# Patient Record
Sex: Female | Born: 1935 | Race: White | Hispanic: No | Smoking: Never smoker
Health system: Southern US, Community
[De-identification: ages and names within clinical notes are randomized; demographics above are authoritative.]

## PROBLEM LIST (undated history)

## (undated) DIAGNOSIS — I10 Essential (primary) hypertension: Secondary | ICD-10-CM

## (undated) DIAGNOSIS — F0390 Unspecified dementia without behavioral disturbance: Secondary | ICD-10-CM

## (undated) DIAGNOSIS — I509 Heart failure, unspecified: Secondary | ICD-10-CM

## (undated) HISTORY — PX: EYE SURGERY: SHX253

---

## 2018-10-31 ENCOUNTER — Ambulatory Visit: Payer: Medicare Other | Admitting: Physical Therapy

## 2018-11-02 ENCOUNTER — Ambulatory Visit: Payer: Self-pay | Admitting: Physical Therapy

## 2018-11-07 ENCOUNTER — Ambulatory Visit: Payer: Self-pay | Admitting: Physical Therapy

## 2018-11-14 ENCOUNTER — Ambulatory Visit: Payer: Medicare Other | Admitting: Physical Therapy

## 2018-11-16 ENCOUNTER — Ambulatory Visit: Payer: Medicare Other

## 2018-11-21 ENCOUNTER — Ambulatory Visit: Payer: Medicare Other | Admitting: Physical Therapy

## 2018-11-23 ENCOUNTER — Ambulatory Visit: Payer: Medicare Other | Admitting: Physical Therapy

## 2018-11-30 ENCOUNTER — Ambulatory Visit: Payer: Medicare Other | Admitting: Physical Therapy

## 2018-12-05 ENCOUNTER — Ambulatory Visit: Payer: Medicare Other | Admitting: Physical Therapy

## 2018-12-07 ENCOUNTER — Ambulatory Visit: Payer: Medicare Other | Admitting: Physical Therapy

## 2018-12-12 ENCOUNTER — Ambulatory Visit: Payer: Medicare Other | Admitting: Physical Therapy

## 2018-12-14 ENCOUNTER — Ambulatory Visit: Payer: Medicare Other | Admitting: Physical Therapy

## 2018-12-18 ENCOUNTER — Ambulatory Visit: Payer: Medicare Other | Admitting: Physical Therapy

## 2018-12-19 ENCOUNTER — Ambulatory Visit: Payer: Medicare Other | Admitting: Physical Therapy

## 2018-12-21 ENCOUNTER — Ambulatory Visit: Payer: Medicare Other | Admitting: Physical Therapy

## 2018-12-26 ENCOUNTER — Ambulatory Visit: Payer: Medicare Other | Admitting: Physical Therapy

## 2018-12-28 ENCOUNTER — Ambulatory Visit: Payer: Medicare Other | Admitting: Physical Therapy

## 2019-04-08 ENCOUNTER — Emergency Department: Payer: Medicare Other

## 2019-04-08 ENCOUNTER — Other Ambulatory Visit: Payer: Self-pay

## 2019-04-08 ENCOUNTER — Emergency Department
Admission: EM | Admit: 2019-04-08 | Discharge: 2019-04-08 | Disposition: A | Discharge status: Home / Self Care | Payer: Medicare Other | Attending: Student in an Organized Health Care Education/Training Program | Admitting: Student in an Organized Health Care Education/Training Program

## 2019-04-08 DIAGNOSIS — Z8673 Personal history of transient ischemic attack (TIA), and cerebral infarction without residual deficits: Secondary | ICD-10-CM | POA: Insufficient documentation

## 2019-04-08 DIAGNOSIS — R1011 Right upper quadrant pain: Secondary | ICD-10-CM | POA: Insufficient documentation

## 2019-04-08 DIAGNOSIS — R1900 Intra-abdominal and pelvic swelling, mass and lump, unspecified site: Secondary | ICD-10-CM

## 2019-04-08 DIAGNOSIS — R531 Weakness: Secondary | ICD-10-CM | POA: Diagnosis not present

## 2019-04-08 DIAGNOSIS — I110 Hypertensive heart disease with heart failure: Secondary | ICD-10-CM | POA: Insufficient documentation

## 2019-04-08 DIAGNOSIS — R1909 Other intra-abdominal and pelvic swelling, mass and lump: Secondary | ICD-10-CM | POA: Diagnosis not present

## 2019-04-08 DIAGNOSIS — M25561 Pain in right knee: Secondary | ICD-10-CM | POA: Insufficient documentation

## 2019-04-08 DIAGNOSIS — Z7902 Long term (current) use of antithrombotics/antiplatelets: Secondary | ICD-10-CM | POA: Diagnosis not present

## 2019-04-08 DIAGNOSIS — I509 Heart failure, unspecified: Secondary | ICD-10-CM | POA: Diagnosis not present

## 2019-04-08 HISTORY — DX: Essential (primary) hypertension: I10

## 2019-04-08 HISTORY — DX: Heart failure, unspecified: I50.9

## 2019-04-08 LAB — CBC WITH DIFFERENTIAL/PLATELET
Abs Immature Granulocytes: 0.01 10*3/uL (ref 0.00–0.07)
Basophils Absolute: 0 10*3/uL (ref 0.0–0.1)
Basophils Relative: 0 %
Eosinophils Absolute: 0.1 10*3/uL (ref 0.0–0.5)
Eosinophils Relative: 2 %
HCT: 42.1 % (ref 36.0–46.0)
Hemoglobin: 14.2 g/dL (ref 12.0–15.0)
Immature Granulocytes: 0 %
Lymphocytes Relative: 31 %
Lymphs Abs: 1.9 10*3/uL (ref 0.7–4.0)
MCH: 31.8 pg (ref 26.0–34.0)
MCHC: 33.7 g/dL (ref 30.0–36.0)
MCV: 94.4 fL (ref 80.0–100.0)
Monocytes Absolute: 0.7 10*3/uL (ref 0.1–1.0)
Monocytes Relative: 11 %
Neutro Abs: 3.3 10*3/uL (ref 1.7–7.7)
Neutrophils Relative %: 56 %
Platelets: 249 10*3/uL (ref 150–400)
RBC: 4.46 MIL/uL (ref 3.87–5.11)
RDW: 14.6 % (ref 11.5–15.5)
WBC: 5.9 10*3/uL (ref 4.0–10.5)
nRBC: 0 % (ref 0.0–0.2)

## 2019-04-08 LAB — URINALYSIS, COMPLETE (UACMP) WITH MICROSCOPIC
Bilirubin Urine: NEGATIVE
Glucose, UA: NEGATIVE mg/dL
Hgb urine dipstick: NEGATIVE
Ketones, ur: NEGATIVE mg/dL
Leukocytes,Ua: NEGATIVE
Nitrite: NEGATIVE
Protein, ur: NEGATIVE mg/dL
Specific Gravity, Urine: 1.006 (ref 1.005–1.030)
WBC, UA: NONE SEEN WBC/hpf (ref 0–5)
pH: 8 (ref 5.0–8.0)

## 2019-04-08 LAB — COMPREHENSIVE METABOLIC PANEL
ALT: 21 U/L (ref 0–44)
AST: 26 U/L (ref 15–41)
Albumin: 3.6 g/dL (ref 3.5–5.0)
Alkaline Phosphatase: 97 U/L (ref 38–126)
Anion gap: 9 (ref 5–15)
BUN: 21 mg/dL (ref 8–23)
CO2: 29 mmol/L (ref 22–32)
Calcium: 9.6 mg/dL (ref 8.9–10.3)
Chloride: 100 mmol/L (ref 98–111)
Creatinine, Ser: 1.05 mg/dL — ABNORMAL HIGH (ref 0.44–1.00)
GFR calc Af Amer: 54 mL/min — ABNORMAL LOW (ref >60)
GFR calc non Af Amer: 46 mL/min — ABNORMAL LOW (ref >60)
Glucose, Bld: 114 mg/dL — ABNORMAL HIGH (ref 70–99)
Potassium: 4.7 mmol/L (ref 3.5–5.1)
Sodium: 138 mmol/L (ref 135–145)
Total Bilirubin: 1.1 mg/dL (ref 0.3–1.2)
Total Protein: 7.2 g/dL (ref 6.5–8.1)

## 2019-04-08 MED ORDER — IOHEXOL 300 MG/ML  SOLN
100.0000 mL | Freq: Once | INTRAMUSCULAR | Status: AC | PRN
  Administered 2019-04-08: 14:00:00 100 mL via INTRAVENOUS

## 2019-04-08 NOTE — ED Provider Notes (Signed)
[facility] Emergency Department Provider Note    First MD Initiated Contact with Patient 04/08/19 1241     (approximate)  I have reviewed the triage vital signs and the nursing notes.   HISTORY  Chief Complaint Bone Pain    HPI Emily Martin is a 85 y.o. female of heart failure as well as hypertension and TIA on Plavix presents the ER with generalized weakness and confusion starting this morning.  No report of any falls.  Patient was having some chills and complaining of abdominal pain.  She is currently having a little bit of right upper quadrant abdominal pain but states it is otherwise improved.  Did not have any measured fevers at home but family states that she felt warm and they were concerned for fever.  Not had any cough or congestion.  No recent antibiotics. Have pt and HH at home.    Past Medical History:  Diagnosis Date  . CHF (congestive heart failure) ([place])   . Hypertension    No family history on file. Past Surgical History:  Procedure Laterality Date  . EYE SURGERY     There are no active problems to display for this patient.     Prior to Admission medications   Not on File    Allergies Lisinopril    Social History Social History   Tobacco Use  . Smoking status: Never Smoker  . Smokeless tobacco: Never Used  Substance Use Topics  . Alcohol use: Not Currently  . Drug use: Never    Review of Systems Patient denies headaches, rhinorrhea, blurry vision, numbness, shortness of breath, chest pain, edema, cough, abdominal pain, nausea, vomiting, diarrhea, dysuria, fevers, rashes or hallucinations unless otherwise stated above in HPI. ____________________________________________   PHYSICAL EXAM:  VITAL SIGNS: Vitals:   04/08/19 1300 04/08/19 1536  BP: (!) 172/123 (!) 181/93  Pulse: 74   Resp: 15 20  Temp:    SpO2: 96% 100%    Constitutional: Alert and oriented. pleasant Eyes: Conjunctivae are normal.  Head:  Atraumatic. Nose: No congestion/rhinnorhea. Mouth/Throat: Mucous membranes are moist.   Neck: No stridor. Painless ROM.  Cardiovascular: Normal rate, regular rhythm. Grossly normal heart sounds.  Good peripheral circulation. Respiratory: Normal respiratory effort.  No retractions. Lungs CTAB. Gastrointestinal: Soft with mild ruq ttp. No distention. No abdominal bruits. No CVA tenderness. Genitourinary:  Musculoskeletal: No lower extremity tenderness nor edema.  ttp of right knee without overlying warmth or erythema. No joint effusions. Neurologic:  Normal speech and language. No gross focal neurologic deficits are appreciated. No facial droop  MAE. Able to ambulate.  No drift Skin:  Skin is warm, dry and intact. No rash noted. Psychiatric: Mood and affect are normal. Speech and behavior are normal.  ____________________________________________   LABS (all labs ordered are listed, but only abnormal results are displayed)  Results for orders placed or performed during the hospital encounter of 04/08/19 (from the past 24 hour(s))  CBC with Differential/Platelet     Status: None   Collection Time: 04/08/19 12:59 PM  Result Value Ref Range   WBC 5.9 4.0 - 10.5 K/uL   RBC 4.46 3.87 - 5.11 MIL/uL   Hemoglobin 14.2 12.0 - 15.0 g/dL   HCT 42.1 36.0 - 46.0 %   MCV 94.4 80.0 - 100.0 fL   MCH 31.8 26.0 - 34.0 pg   MCHC 33.7 30.0 - 36.0 g/dL   RDW 14.6 11.5 - 15.5 %   Platelets 249 150 - 400 K/uL  nRBC 0.0 0.0 - 0.2 %   Neutrophils Relative % 56 %   Neutro Abs 3.3 1.7 - 7.7 K/uL   Lymphocytes Relative 31 %   Lymphs Abs 1.9 0.7 - 4.0 K/uL   Monocytes Relative 11 %   Monocytes Absolute 0.7 0.1 - 1.0 K/uL   Eosinophils Relative 2 %   Eosinophils Absolute 0.1 0.0 - 0.5 K/uL   Basophils Relative 0 %   Basophils Absolute 0.0 0.0 - 0.1 K/uL   Immature Granulocytes 0 %   Abs Immature Granulocytes 0.01 0.00 - 0.07 K/uL  Comprehensive metabolic panel     Status: Abnormal   Collection Time:  04/08/19 12:59 PM  Result Value Ref Range   Sodium 138 135 - 145 mmol/L   Potassium 4.7 3.5 - 5.1 mmol/L   Chloride 100 98 - 111 mmol/L   CO2 29 22 - 32 mmol/L   Glucose, Bld 114 (H) 70 - 99 mg/dL   BUN 21 8 - 23 mg/dL   Creatinine, Ser 1.05 (H) 0.44 - 1.00 mg/dL   Calcium 9.6 8.9 - 10.3 mg/dL   Total Protein 7.2 6.5 - 8.1 g/dL   Albumin 3.6 3.5 - 5.0 g/dL   AST 26 15 - 41 U/L   ALT 21 0 - 44 U/L   Alkaline Phosphatase 97 38 - 126 U/L   Total Bilirubin 1.1 0.3 - 1.2 mg/dL   GFR calc non Af Amer 46 (L) >60 mL/min   GFR calc Af Amer 54 (L) >60 mL/min   Anion gap 9 5 - 15  Urinalysis, Complete w Microscopic     Status: Abnormal   Collection Time: 04/08/19  1:14 PM  Result Value Ref Range   Color, Urine STRAW (A) YELLOW   APPearance CLEAR (A) CLEAR   Specific Gravity, Urine 1.006 1.005 - 1.030   pH 8.0 5.0 - 8.0   Glucose, UA NEGATIVE NEGATIVE mg/dL   Hgb urine dipstick NEGATIVE NEGATIVE   Bilirubin Urine NEGATIVE NEGATIVE   Ketones, ur NEGATIVE NEGATIVE mg/dL   Protein, ur NEGATIVE NEGATIVE mg/dL   Nitrite NEGATIVE NEGATIVE   Leukocytes,Ua NEGATIVE NEGATIVE   RBC / HPF 0-5 0 - 5 RBC/hpf   WBC, UA NONE SEEN 0 - 5 WBC/hpf   Bacteria, UA RARE (A) NONE SEEN   Squamous Epithelial / LPF 0-5 0 - 5   ____________________________________________  EKG My review and personal interpretation at Time: 12:33   Indication: weakness  Rate: 80  Rhythm: sinus Axis: normal Other: normal intervals, no stemi ____________________________________________  RADIOLOGY  I personally reviewed all radiographic images ordered to evaluate for the above acute complaints and reviewed radiology reports and findings.  These findings were personally discussed with the patient.  Please see medical record for radiology report.  ____________________________________________   PROCEDURES  Procedure(s) performed:  Procedures    Critical Care performed: no ____________________________________________    INITIAL IMPRESSION / ASSESSMENT AND PLAN / ED COURSE  Pertinent labs & imaging results that were available during my care of the patient were reviewed by me and considered in my medical decision making (see chart for details).   DDX: Dehydration, sepsis, pna, uti, hypoglycemia, cva, drug effect, withdrawal, encephalitis   Kenisha Jeffery is a 85 y.o. who presents to the ED with sxs as described above.  Patient is AFVSS in ED. Exam as above. Given current presentation have considered the above differential.  Will order blood work, ct and radiographs for the above differential.  The patient will be  placed on continuous pulse oximetry and telemetry for monitoring.  Laboratory evaluation will be sent to evaluate for the above complaints.      Clinical Course as of Apr 08 1599  Sun Apr 08, 2019  1516 Discussed CT imaging with patient and family member at bedside.  These masses are reportedly known and they had elected to not pursue any diagnostic testing previously but would like referral to OB/GYN.  She does not have any acute pain at this time.  Patient feels like she is back at her baseline.     [PR]  1551 Patient able to ambulate with use of assistive device.  Discussed possibility of TIA with patient and family.  She is requesting discharge home to follow-up with outpatient follow-up.  Do not identify any objective finding suggest CVA.  Has had multiple TIAs in the past.  She is established with PCP.  She adamantly does not want to be in the hospital which I think is reasonable given her age.  Family member at bedside agrees with plan.  Discussed signs and symptoms for which she should return to the ER.   [PR]    Clinical Course User Index [PR] Merlyn Lot, MD    The patient was evaluated in Emergency Department today for the symptoms described in the history of present illness. He/she was evaluated in the context of the global COVID-19 pandemic, which necessitated consideration that the  patient might be at risk for infection with the SARS-CoV-2 virus that causes COVID-19. Institutional protocols and algorithms that pertain to the evaluation of patients at risk for COVID-19 are in a state of rapid change based on information released by regulatory bodies including the CDC and federal and state organizations. These policies and algorithms were followed during the patient's care in the ED.  As part of my medical decision making, I reviewed the following data within the [facility] notes reviewed and incorporated, Labs reviewed, notes from prior ED visits and [place] Controlled Substance Database   ____________________________________________   FINAL CLINICAL IMPRESSION(S) / ED DIAGNOSES  Final diagnoses:  Weakness  Acute pain of right knee  Pelvic mass in female      NEW MEDICATIONS STARTED DURING THIS VISIT:  New Prescriptions   No medications on file     Note:  This document was prepared using Dragon voice recognition software and may include unintentional dictation errors.    Merlyn Lot, MD 04/08/19 1600

## 2019-04-08 NOTE — ED Notes (Signed)
Pt ambulatory in room from bed to toilet and back to bed via walker. Per granddaughter pt is walking at her baseline. Pt denies any dizziness or problems with walking. Dr. Quentin Cornwall notified.

## 2019-04-08 NOTE — ED Triage Notes (Signed)
Pt arrived via [facility] from home, pt complaining of bone pain/ joint pain.  Zero pain at this time.

## 2019-09-07 ENCOUNTER — Emergency Department: Payer: Medicare Other

## 2019-09-07 ENCOUNTER — Emergency Department
Admission: EM | Admit: 2019-09-07 | Discharge: 2019-09-07 | Disposition: A | Discharge status: Home / Self Care | Payer: Medicare Other | Attending: Emergency Medicine | Admitting: Emergency Medicine

## 2019-09-07 ENCOUNTER — Other Ambulatory Visit: Payer: Self-pay

## 2019-09-07 ENCOUNTER — Encounter: Payer: Self-pay | Admitting: Emergency Medicine

## 2019-09-07 DIAGNOSIS — I509 Heart failure, unspecified: Secondary | ICD-10-CM | POA: Insufficient documentation

## 2019-09-07 DIAGNOSIS — Y929 Unspecified place or not applicable: Secondary | ICD-10-CM | POA: Diagnosis not present

## 2019-09-07 DIAGNOSIS — I110 Hypertensive heart disease with heart failure: Secondary | ICD-10-CM | POA: Insufficient documentation

## 2019-09-07 DIAGNOSIS — Y9389 Activity, other specified: Secondary | ICD-10-CM | POA: Diagnosis not present

## 2019-09-07 DIAGNOSIS — Y998 Other external cause status: Secondary | ICD-10-CM | POA: Diagnosis not present

## 2019-09-07 DIAGNOSIS — S300XXA Contusion of lower back and pelvis, initial encounter: Secondary | ICD-10-CM | POA: Diagnosis not present

## 2019-09-07 DIAGNOSIS — Z20822 Contact with and (suspected) exposure to covid-19: Secondary | ICD-10-CM | POA: Insufficient documentation

## 2019-09-07 DIAGNOSIS — W010XXA Fall on same level from slipping, tripping and stumbling without subsequent striking against object, initial encounter: Secondary | ICD-10-CM | POA: Insufficient documentation

## 2019-09-07 DIAGNOSIS — F0390 Unspecified dementia without behavioral disturbance: Secondary | ICD-10-CM | POA: Diagnosis not present

## 2019-09-07 DIAGNOSIS — W19XXXA Unspecified fall, initial encounter: Secondary | ICD-10-CM

## 2019-09-07 DIAGNOSIS — S3992XA Unspecified injury of lower back, initial encounter: Secondary | ICD-10-CM | POA: Diagnosis present

## 2019-09-07 HISTORY — DX: Unspecified dementia, unspecified severity, without behavioral disturbance, psychotic disturbance, mood disturbance, and anxiety: F03.90

## 2019-09-07 LAB — COMPREHENSIVE METABOLIC PANEL
ALT: 34 U/L (ref 0–44)
AST: 29 U/L (ref 15–41)
Albumin: 3.4 g/dL — ABNORMAL LOW (ref 3.5–5.0)
Alkaline Phosphatase: 105 U/L (ref 38–126)
Anion gap: 11 (ref 5–15)
BUN: 20 mg/dL (ref 8–23)
CO2: 28 mmol/L (ref 22–32)
Calcium: 9.2 mg/dL (ref 8.9–10.3)
Chloride: 104 mmol/L (ref 98–111)
Creatinine, Ser: 0.61 mg/dL (ref 0.44–1.00)
GFR calc Af Amer: >60 mL/min (ref >60)
GFR calc non Af Amer: >60 mL/min (ref >60)
Glucose, Bld: 119 mg/dL — ABNORMAL HIGH (ref 70–99)
Potassium: 4.3 mmol/L (ref 3.5–5.1)
Sodium: 143 mmol/L (ref 135–145)
Total Bilirubin: 0.9 mg/dL (ref 0.3–1.2)
Total Protein: 6.7 g/dL (ref 6.5–8.1)

## 2019-09-07 LAB — CBC WITH DIFFERENTIAL/PLATELET
Abs Immature Granulocytes: 0.06 10*3/uL (ref 0.00–0.07)
Basophils Absolute: 0 10*3/uL (ref 0.0–0.1)
Basophils Relative: 0 %
Eosinophils Absolute: 0.1 10*3/uL (ref 0.0–0.5)
Eosinophils Relative: 1 %
HCT: 42.5 % (ref 36.0–46.0)
Hemoglobin: 13.8 g/dL (ref 12.0–15.0)
Immature Granulocytes: 1 %
Lymphocytes Relative: 16 %
Lymphs Abs: 1.5 10*3/uL (ref 0.7–4.0)
MCH: 31.6 pg (ref 26.0–34.0)
MCHC: 32.5 g/dL (ref 30.0–36.0)
MCV: 97.3 fL (ref 80.0–100.0)
Monocytes Absolute: 1.3 10*3/uL — ABNORMAL HIGH (ref 0.1–1.0)
Monocytes Relative: 14 %
Neutro Abs: 6.1 10*3/uL (ref 1.7–7.7)
Neutrophils Relative %: 68 %
Platelets: 204 10*3/uL (ref 150–400)
RBC: 4.37 MIL/uL (ref 3.87–5.11)
RDW: 15.1 % (ref 11.5–15.5)
WBC: 9.1 10*3/uL (ref 4.0–10.5)
nRBC: 0 % (ref 0.0–0.2)

## 2019-09-07 LAB — PROTIME-INR
INR: 1 (ref 0.8–1.2)
Prothrombin Time: 12.8 seconds (ref 11.4–15.2)

## 2019-09-07 LAB — TYPE AND SCREEN
ABO/RH(D): A POS
Antibody Screen: NEGATIVE

## 2019-09-07 NOTE — ED Notes (Signed)
Pt had a 20g in the RT FA that was removed prior to ED departure,. Site clean, warm and dry./ Catheter in tact.

## 2019-09-07 NOTE — ED Provider Notes (Signed)
[facility] Emergency Department Provider Note  ____________________________________________  Time seen: Approximately 6:49 PM  I have reviewed the triage vital signs and the nursing notes.   HISTORY  Chief Complaint Fall    HPI Emily Martin is a 86 y.o. female who presents the emergency department for evaluation after a fall today.  Patient's granddaughter was with the patient, states that she was using her walker, became out of balance, was leaning backwards and she could not catch her before she fell.  Patient fell in a seated position landing on her buttocks.  She did not hit her head or pass out.  This is patient's second fall, she did have a fall 2 days ago.   The fall 2 days ago patient did hit her head.  She is not on blood thinners.  She has had no change in mental status.  She does have dementia but there is no significant changes from her baseline.  Patient has had no vomiting, subsequent syncope.  Patient had not been complaining of headache or visual changes to her granddaughter.  Patient was brought to the emergency department for further evaluation.  History of CHF, dementia and hypertension.        Past Medical History:  Diagnosis Date  . CHF (congestive heart failure) ([place])   . Dementia ([place])   . Hypertension     There are no problems to display for this patient.   Past Surgical History:  Procedure Laterality Date  . EYE SURGERY      Prior to Admission medications   Not on File    Allergies Lisinopril  No family history on file.  Social History Social History   Tobacco Use  . Smoking status: Never Smoker  . Smokeless tobacco: Never Used  Substance Use Topics  . Alcohol use: Not Currently  . Drug use: Never     Review of Systems  Constitutional: No fever/chills Eyes: No visual changes. No discharge ENT: No upper respiratory complaints. Cardiovascular: no chest pain. Respiratory: no cough. No SOB. Gastrointestinal:  No abdominal pain.  No nausea, no vomiting.  No diarrhea.  No constipation. Musculoskeletal: Positive for tailbone pain after a fall Skin: Negative for rash, abrasions, lacerations, ecchymosis. Neurological: Negative for headaches, focal weakness or numbness. 10-point ROS otherwise negative.  ____________________________________________   PHYSICAL EXAM:  VITAL SIGNS: ED Triage Vitals [09/07/19 1712]  Enc Vitals Group     BP (!) 195/89     Pulse Rate 89     Resp 16     Temp 98.8 F (37.1 C)     Temp Source Oral     SpO2 95 %     Weight      Height      Head Circumference      Peak Flow      Pain Score      Pain Loc      Pain Edu?      Excl. in [place]?      Constitutional: Alert and oriented. Well appearing and in no acute distress. Eyes: Conjunctivae are normal. PERRL. EOMI. Head: Atraumatic. ENT:      Ears:       Nose: No congestion/rhinnorhea.      Mouth/Throat: Mucous membranes are moist.  Neck: No stridor.  No cervical spine tenderness to palpation.  Cardiovascular: Normal rate, regular rhythm. Normal S1 and S2.  Good peripheral circulation. Respiratory: Normal respiratory effort without tachypnea or retractions. Lungs CTAB. Good air entry to the bases  with no decreased or absent breath sounds. Gastrointestinal: Bowel sounds 4 quadrants. Soft and nontender to palpation. No guarding or rigidity. No palpable masses. No distention. No CVA tenderness. Musculoskeletal: Full range of motion to all extremities. No gross deformities appreciated.  Visualization of extremities reveals no shortening or rotation of the lower extremities, no obvious deformity to the upper extremities.  No laceration, abrasions, significant areas of ecchymosis.  Patient is tender to palpation in the lower lumbar region extending into the sacral spine.  Mild SI joint tenderness bilaterally.  No significant tenderness to palpation over bilateral iliac crest, bilateral hips.  Examination of the extremities  and is otherwise reassuring.   Neurologic:  Normal speech and language. No gross focal neurologic deficits are appreciated.  Skin:  Skin is warm, dry and intact. No rash noted. Psychiatric: Mood and affect are normal. Speech and behavior are normal. Patient exhibits appropriate but slightly limited insight given her dementia, however is at baseline according to the granddaughter.   ____________________________________________   LABS (all labs ordered are listed, but only abnormal results are displayed)  Labs Reviewed  CBC WITH DIFFERENTIAL/PLATELET - Abnormal; Notable for the following components:      Result Value   Monocytes Absolute 1.3 (*)    All other components within normal limits  COMPREHENSIVE METABOLIC PANEL - Abnormal; Notable for the following components:   Glucose, Bld 119 (*)    Albumin 3.4 (*)    All other components within normal limits  SARS CORONAVIRUS 2 (TAT 6-24 HRS)  PROTIME-INR  URINALYSIS, COMPLETE (UACMP) WITH MICROSCOPIC  TYPE AND SCREEN   ____________________________________________  EKG   ____________________________________________  RADIOLOGY I personally viewed and evaluated these images as part of my medical decision making, as well as reviewing the written report by the radiologist.  DG Lumbar Spine 2-3 Views  Result Date: 09/07/2019 CLINICAL DATA:  86 year old female with fall and back pain. EXAM: LUMBAR SPINE - 2-3 VIEW COMPARISON:  CT of the abdomen pelvis dated 04/08/2019. FINDINGS: There is no acute fracture or subluxation of the lumbar spine. Old T11 compression fracture similar to prior CT. There is grade 2 L5-S1 anterolisthesis. The bones are osteopenic. Lower lumbar facet arthropathy. The soft tissues are unremarkable. IMPRESSION: 1. No acute/traumatic lumbar spine pathology. 2. Osteopenia with multilevel degenerative changes and grade 2 L5-S1 anterolisthesis. 3. Old T11 compression fracture similar to prior CT. Electronically Signed   By:  Anner Crete M.D.   On: 09/07/2019 18:47   DG Sacrum/Coccyx  Result Date: 09/07/2019 CLINICAL DATA:  Unwitnessed fall, low back pain EXAM: SACRUM AND COCCYX - 2+ VIEW COMPARISON:  CT abdomen and pelvis 04/08/2019 FINDINGS: No visible sacral or coccygeal fracture or focal bone lesion. Stable 11 mm anterolisthesis of L4 on L5 with advanced degenerative disc and facet disease. IMPRESSION: No acute bony abnormality. Electronically Signed   By: Rolm Baptise M.D.   On: 09/07/2019 18:48   CT Head Wo Contrast  Result Date: 09/07/2019 CLINICAL DATA:  Fall EXAM: CT HEAD WITHOUT CONTRAST CT CERVICAL SPINE WITHOUT CONTRAST TECHNIQUE: Multidetector CT imaging of the head and cervical spine was performed following the standard protocol without intravenous contrast. Multiplanar CT image reconstructions of the cervical spine were also generated. COMPARISON:  CT brain 04/08/2019 FINDINGS: CT HEAD FINDINGS Brain: No acute territorial infarction, hemorrhage or intracranial mass. Atrophy. Extensive hypodensity within the white matter presumably due to chronic small vessel ischemic change. Chronic lacunar infarcts in the basal ganglia. Chronic small bilateral infarcts within the cerebellum. Encephalomalacia  within the right temporal lobe consistent with chronic infarct. Mildly enlarged ventricles without change Vascular: No hyperdense vessels. Carotid vascular and vertebral artery calcification Skull: Normal. Negative for fracture or focal lesion. Sinuses/Orbits: Probable old fracture deformity medial wall left orbit. Small fluid in the right sphenoid sinus. Mucosal thickening in the ethmoid sinuses Other: None CT CERVICAL SPINE FINDINGS Alignment: No subluxation.  Facet alignment within normal limits. Skull base and vertebrae: No acute fracture. No primary bone lesion or focal pathologic process. Soft tissues and spinal canal: No prevertebral fluid or swelling. No visible canal hematoma. Disc levels: Mild diffuse  degenerative changes of the cervical spine. Multiple level facet degenerative change. Upper chest: Lung apices are clear. 1 cm calcified nodule right lobe of thyroid. 1.3 cm hypodense nodule left lobe of thyroid. No further workup recommended based on size of lesion and age of patient. Other: None IMPRESSION: 1. No CT evidence for acute intracranial abnormality. Atrophy and extensive chronic small vessel ischemic change of the white matter. Chronic right temporal lobe infarct. 2. Degenerative changes of the cervical spine. No acute osseous abnormality. Electronically Signed   By: Donavan Foil M.D.   On: 09/07/2019 18:55   CT Cervical Spine Wo Contrast  Result Date: 09/07/2019 CLINICAL DATA:  Fall EXAM: CT HEAD WITHOUT CONTRAST CT CERVICAL SPINE WITHOUT CONTRAST TECHNIQUE: Multidetector CT imaging of the head and cervical spine was performed following the standard protocol without intravenous contrast. Multiplanar CT image reconstructions of the cervical spine were also generated. COMPARISON:  CT brain 04/08/2019 FINDINGS: CT HEAD FINDINGS Brain: No acute territorial infarction, hemorrhage or intracranial mass. Atrophy. Extensive hypodensity within the white matter presumably due to chronic small vessel ischemic change. Chronic lacunar infarcts in the basal ganglia. Chronic small bilateral infarcts within the cerebellum. Encephalomalacia within the right temporal lobe consistent with chronic infarct. Mildly enlarged ventricles without change Vascular: No hyperdense vessels. Carotid vascular and vertebral artery calcification Skull: Normal. Negative for fracture or focal lesion. Sinuses/Orbits: Probable old fracture deformity medial wall left orbit. Small fluid in the right sphenoid sinus. Mucosal thickening in the ethmoid sinuses Other: None CT CERVICAL SPINE FINDINGS Alignment: No subluxation.  Facet alignment within normal limits. Skull base and vertebrae: No acute fracture. No primary bone lesion or focal  pathologic process. Soft tissues and spinal canal: No prevertebral fluid or swelling. No visible canal hematoma. Disc levels: Mild diffuse degenerative changes of the cervical spine. Multiple level facet degenerative change. Upper chest: Lung apices are clear. 1 cm calcified nodule right lobe of thyroid. 1.3 cm hypodense nodule left lobe of thyroid. No further workup recommended based on size of lesion and age of patient. Other: None IMPRESSION: 1. No CT evidence for acute intracranial abnormality. Atrophy and extensive chronic small vessel ischemic change of the white matter. Chronic right temporal lobe infarct. 2. Degenerative changes of the cervical spine. No acute osseous abnormality. Electronically Signed   By: Donavan Foil M.D.   On: 09/07/2019 18:55   DG HIP UNILAT WITH PELVIS 2-3 VIEWS LEFT  Result Date: 09/07/2019 CLINICAL DATA:  Fall EXAM: DG HIP (WITH OR WITHOUT PELVIS) 2-3V LEFT COMPARISON:  None. FINDINGS: Symmetric degenerative changes in the hips bilaterally with joint space narrowing and spurring. No acute bony abnormality. Specifically, no fracture, subluxation, or dislocation. IMPRESSION: No acute bony abnormality. Electronically Signed   By: Rolm Baptise M.D.   On: 09/07/2019 18:49   DG HIP UNILAT WITH PELVIS 2-3 VIEWS RIGHT  Result Date: 09/07/2019 CLINICAL DATA:  Unwitnessed fall. EXAM:  DG HIP (WITH OR WITHOUT PELVIS) 2-3V RIGHT COMPARISON:  None. FINDINGS: Symmetric degenerative changes in the hip joints bilaterally with joint space narrowing and spurring. No acute bony abnormality. Specifically, no fracture, subluxation, or dislocation. IMPRESSION: No acute bony abnormality. Electronically Signed   By: Rolm Baptise M.D.   On: 09/07/2019 18:49    ____________________________________________    PROCEDURES  Procedure(s) performed:    Procedures    Medications - No data to display   ____________________________________________   INITIAL IMPRESSION / ASSESSMENT AND  PLAN / ED COURSE  Pertinent labs & imaging results that were available during my care of the patient were reviewed by me and considered in my medical decision making (see chart for details).  Review of the [place] CSRS was performed in accordance of the [place] prior to dispensing any controlled drugs.  Clinical Course as of Sep 07 1931  Fri Sep 07, 2019  1931 Patient presented to the emergency department with her granddaughter after a fall.  Patient lost her balance while using her walker, fell backwards landing on her buttocks.  Patient did not hit her head or lose consciousness.  She did have a recent fall 2 days ago in which she did hit her head.  Given this, patient was evaluated with labs, imaging.  Initial lumbar spine imaging was concerning that there may be a hip fracture, specifically about the left.  When comparing the 2 surgical notes it appeared that the left was shorter than the right.  Dedicated imaging of the hips reveals no fracture however.  Patient is at her baseline which is demented, however very agreeable.  No concerning findings on patient's remaining imaging.  No significant findings on patient's labs.  Patient is stable for discharge at this time.   [JC]    Clinical Course User Index [JC] Valla Pacey, Charline Bills, PA-C          Patient's diagnosis is consistent with fall, contusion of the coccyx.  Patient presented to emergency department after a fall today.  Overall exam was reassuring.  Imaging reveals no acute traumatic findings.  Patient is stable for discharge into her granddaughter's care..  No further work-up at this time.  No prescriptions at this time.  Follow-up primary care as needed.  Patient is given ED precautions to return to the ED for any worsening or new symptoms.     ____________________________________________  FINAL CLINICAL IMPRESSION(S) / ED DIAGNOSES  Final diagnoses:  Fall, initial encounter  Contusion of coccyx, initial encounter      NEW  MEDICATIONS STARTED DURING THIS VISIT:  ED Discharge Orders    None          This chart was dictated using voice recognition software/Dragon. Despite best efforts to proofread, errors can occur which can change the meaning. Any change was purely unintentional.    Darletta Moll, PA-C 09/07/19 1933    Nance Pear, MD 09/07/19 [phone]

## 2019-09-07 NOTE — ED Triage Notes (Signed)
Pt to ED via POV with [place] daughter who states that pt fell today. Pt was standing behind her walker and lost her balance, falling flat on her bottom. Pt is now c/o tailbone pain.  Pt grand daughter also noted that pt had a fall on Tuesday and hit her head. States that pt has been fine since fall on Tuesday. No vomiting or AMS. Pt is not on blood thinners. Pt grand daughter states that for about 1-2 seconds after the fall she did not respond when they talked to her but after that she was fine. Pt is in NAD at this time. Pt does have hx/o dementia

## 2019-09-08 LAB — SARS CORONAVIRUS 2 (TAT 6-24 HRS): SARS Coronavirus 2: NEGATIVE

## 2019-09-10 ENCOUNTER — Emergency Department
Admission: EM | Admit: 2019-09-10 | Discharge: 2019-09-10 | Disposition: A | Discharge status: Home / Self Care | Payer: Medicare Other | Attending: Emergency Medicine | Admitting: Emergency Medicine

## 2019-09-10 ENCOUNTER — Other Ambulatory Visit: Payer: Self-pay

## 2019-09-10 ENCOUNTER — Encounter: Payer: Self-pay | Admitting: Emergency Medicine

## 2019-09-10 ENCOUNTER — Emergency Department: Payer: Medicare Other

## 2019-09-10 DIAGNOSIS — I509 Heart failure, unspecified: Secondary | ICD-10-CM | POA: Insufficient documentation

## 2019-09-10 DIAGNOSIS — N390 Urinary tract infection, site not specified: Secondary | ICD-10-CM | POA: Diagnosis not present

## 2019-09-10 DIAGNOSIS — D3912 Neoplasm of uncertain behavior of left ovary: Secondary | ICD-10-CM | POA: Diagnosis not present

## 2019-09-10 DIAGNOSIS — D3911 Neoplasm of uncertain behavior of right ovary: Secondary | ICD-10-CM | POA: Diagnosis not present

## 2019-09-10 DIAGNOSIS — I10 Essential (primary) hypertension: Secondary | ICD-10-CM | POA: Insufficient documentation

## 2019-09-10 DIAGNOSIS — R1030 Lower abdominal pain, unspecified: Secondary | ICD-10-CM | POA: Diagnosis present

## 2019-09-10 DIAGNOSIS — N3001 Acute cystitis with hematuria: Secondary | ICD-10-CM

## 2019-09-10 DIAGNOSIS — R1900 Intra-abdominal and pelvic swelling, mass and lump, unspecified site: Secondary | ICD-10-CM

## 2019-09-10 LAB — URINALYSIS, COMPLETE (UACMP) WITH MICROSCOPIC
Bacteria, UA: NONE SEEN
Bilirubin Urine: NEGATIVE
Glucose, UA: NEGATIVE mg/dL
Ketones, ur: NEGATIVE mg/dL
Nitrite: NEGATIVE
Protein, ur: 100 mg/dL — AB
RBC / HPF: >50 RBC/hpf — ABNORMAL HIGH (ref 0–5)
Specific Gravity, Urine: 1.017 (ref 1.005–1.030)
WBC, UA: >50 WBC/hpf — ABNORMAL HIGH (ref 0–5)
pH: 6 (ref 5.0–8.0)

## 2019-09-10 LAB — CBC
HCT: 42.4 % (ref 36.0–46.0)
Hemoglobin: 14.1 g/dL (ref 12.0–15.0)
MCH: 32.1 pg (ref 26.0–34.0)
MCHC: 33.3 g/dL (ref 30.0–36.0)
MCV: 96.6 fL (ref 80.0–100.0)
Platelets: 187 10*3/uL (ref 150–400)
RBC: 4.39 MIL/uL (ref 3.87–5.11)
RDW: 15.2 % (ref 11.5–15.5)
WBC: 8.9 10*3/uL (ref 4.0–10.5)
nRBC: 0 % (ref 0.0–0.2)

## 2019-09-10 LAB — COMPREHENSIVE METABOLIC PANEL
ALT: 22 U/L (ref 0–44)
AST: 22 U/L (ref 15–41)
Albumin: 3.3 g/dL — ABNORMAL LOW (ref 3.5–5.0)
Alkaline Phosphatase: 113 U/L (ref 38–126)
Anion gap: 11 (ref 5–15)
BUN: 22 mg/dL (ref 8–23)
CO2: 28 mmol/L (ref 22–32)
Calcium: 9.2 mg/dL (ref 8.9–10.3)
Chloride: 103 mmol/L (ref 98–111)
Creatinine, Ser: 0.63 mg/dL (ref 0.44–1.00)
GFR calc Af Amer: >60 mL/min (ref >60)
GFR calc non Af Amer: >60 mL/min (ref >60)
Glucose, Bld: 149 mg/dL — ABNORMAL HIGH (ref 70–99)
Potassium: 4.1 mmol/L (ref 3.5–5.1)
Sodium: 142 mmol/L (ref 135–145)
Total Bilirubin: 1.1 mg/dL (ref 0.3–1.2)
Total Protein: 7.1 g/dL (ref 6.5–8.1)

## 2019-09-10 LAB — LIPASE, BLOOD: Lipase: 15 U/L (ref 11–51)

## 2019-09-10 MED ORDER — CEPHALEXIN 500 MG PO CAPS: 500.0000 mg | ORAL_CAPSULE | Freq: Four times a day (QID) | ORAL | 0 refills | Status: AC

## 2019-09-10 MED ORDER — CEPHALEXIN 500 MG PO CAPS
500.0000 mg | ORAL_CAPSULE | Freq: Once | ORAL | Status: AC
  Administered 2019-09-10: 500 mg via ORAL
  Filled 2019-09-10: qty 1

## 2019-09-10 MED ORDER — SODIUM CHLORIDE 0.9% FLUSH: 3.0000 mL | Freq: Once | INTRAVENOUS | Status: DC

## 2019-09-10 NOTE — ED Notes (Signed)
Report off to sarah rn  

## 2019-09-10 NOTE — ED Notes (Addendum)
Pt reports lower abd pain for 4 days.   Family reports blood in stools and vaginal bleeding today. Small amount of spotting per granddaughter.   No n/v/d.  Pt alert.  Hx dementia   Pt lives with family.  Family with pt in room 24.

## 2019-09-10 NOTE — ED Provider Notes (Signed)
[facility] Emergency Department Provider Note       Time seen: ----------------------------------------- 5:03 PM on 09/10/2019 -----------------------------------------   I have reviewed the triage vital signs and the nursing notes.  HISTORY   Chief Complaint Abdominal Pain    HPI Emily Martin is a 86 y.o. female with a history of CHF, dementia, hypertension who presents to the ED for lower abdominal pain.  Patient's granddaughter states she has been uncomfortable, has a history of dementia but seems as if she is having lower abdominal pain.  She does have some type of history of tumors in her pelvis.  Denies fevers, chills or other complaints.  Past Medical History:  Diagnosis Date  . CHF (congestive heart failure) ([place])   . Dementia ([place])   . Hypertension     There are no problems to display for this patient.   Past Surgical History:  Procedure Laterality Date  . EYE SURGERY      Allergies Lisinopril  Social History Social History   Tobacco Use  . Smoking status: Never Smoker  . Smokeless tobacco: Never Used  Substance Use Topics  . Alcohol use: Not Currently  . Drug use: Never   Review of Systems Constitutional: Negative for fever. Cardiovascular: Negative for chest pain. Respiratory: Negative for shortness of breath. Gastrointestinal: Positive for abdominal pain Musculoskeletal: Negative for back pain. Skin: Negative for rash. Neurological: Positive for weakness  All systems negative/normal/unremarkable except as stated in the HPI  ____________________________________________   PHYSICAL EXAM:  VITAL SIGNS: ED Triage Vitals  Enc Vitals Group     BP 09/10/19 1613 (!) 185/93     Pulse Rate 09/10/19 1613 90     Resp 09/10/19 1613 17     Temp 09/10/19 1613 98.6 F (37 C)     Temp Source 09/10/19 1613 Oral     SpO2 09/10/19 1613 93 %     Weight 09/10/19 1609 138 lb (62.6 kg)     Height 09/10/19 1609 4\' 8"  (1.422 m)   Head Circumference --      Peak Flow --      Pain Score --      Pain Loc --      Pain Edu? --      Excl. in [place]? --    Constitutional:  Well appearing and in no distress. Eyes: Conjunctivae are normal. Normal extraocular movements. ENT      Head: Normocephalic and atraumatic.      Nose: No congestion/rhinnorhea.      Mouth/Throat: Mucous membranes are moist.      Neck: No stridor. Cardiovascular: Normal rate, regular rhythm. No murmurs, rubs, or gallops. Respiratory: Normal respiratory effort without tachypnea nor retractions. Breath sounds are clear and equal bilaterally. No wheezes/rales/rhonchi. Gastrointestinal: Nonfocal lower abdominal tenderness Musculoskeletal: Pain with range of motion of the right knee especially, limited range of motion of the extremities Neurologic:  Normal speech and language. No gross focal neurologic deficits are appreciated.  Skin:  Skin is warm, dry and intact. No rash noted. Psychiatric: Mood and affect are normal. Speech and behavior are normal.  ____________________________________________  ED COURSE:  As part of my medical decision making, I reviewed the following data within the [place] History obtained from family if available, nursing notes, old chart and ekg, as well as notes from prior ED visits. Patient presented for lower abdominal pain, we will assess with labs and imaging as indicated at this time.   Procedures  Emily Martin  was evaluated in Emergency Department on 09/10/2019 for the symptoms described in the history of present illness. She was evaluated in the context of the global COVID-19 pandemic, which necessitated consideration that the patient might be at risk for infection with the SARS-CoV-2 virus that causes COVID-19. Institutional protocols and algorithms that pertain to the evaluation of patients at risk for COVID-19 are in a state of rapid change based on information released by regulatory bodies including the  CDC and federal and state organizations. These policies and algorithms were followed during the patient's care in the ED.  ____________________________________________   LABS (pertinent positives/negatives)  Labs Reviewed  COMPREHENSIVE METABOLIC PANEL - Abnormal; Notable for the following components:      Result Value   Glucose, Bld 149 (*)    Albumin 3.3 (*)    All other components within normal limits  URINALYSIS, COMPLETE (UACMP) WITH MICROSCOPIC - Abnormal; Notable for the following components:   Color, Urine YELLOW (*)    APPearance CLOUDY (*)    Hgb urine dipstick LARGE (*)    Protein, ur 100 (*)    Leukocytes,Ua MODERATE (*)    RBC / HPF >50 (*)    WBC, UA >50 (*)    All other components within normal limits  LIPASE, BLOOD  CBC    RADIOLOGY Images were viewed by me  CT renal protocol IMPRESSION:  1. Negative for hydronephrosis or urinary tract calculus.  2. Large bilateral adnexal masses, probably ovarian in origin and  suspicious for ovarian neoplasm. These appear increased in size  since 04/08/2019. The larger of the 2 masses is now visible to the  left of midline, previously it was seen to the right of midline and  the significance of this finding is uncertain  3. Increased ascites within the pelvis.  4. Age indeterminate mild superior endplate deformity at L2, new  since 04/08/2019  ____________________________________________   DIFFERENTIAL DIAGNOSIS   Renal colic, UTI, pyelonephritis, diverticulitis, appendicitis, dementia  FINAL ASSESSMENT AND PLAN  Abdominal pain, ovarian masses, UTI   Plan: The patient had presented for abdominal pain. Patient's labs were indicative likely of urinary tract infection. Patient's imaging revealed increase in her pelvic masses which the family is aware of.  They did not seek further treatment for this.  I have advised antibiotics for her urine.  She is cleared for outpatient follow-up.   Laurence Aly, MD     Note: This note was generated in part or whole with voice recognition software. Voice recognition is usually quite accurate but there are transcription errors that can and very often do occur. I apologize for any typographical errors that were not detected and corrected.     Emily Newport, MD 09/10/19 [phone]

## 2019-09-10 NOTE — ED Triage Notes (Signed)
Pt presents to ED via POV with c/o lower abdominal pain. Pt's granddaughter states that patient is unable to be comfortable while sitting in chair, pt has hx of dementia at this time. Pt's granddaughter also states that patient has hx of tumors in her abdomen in bilateral pelvis. Pt's granddaughter reports that patient has had intermittent spotting since Friday, denies heavy vaginal bleeding, denies obvious wounds to vaginal area.

## 2019-09-11 ENCOUNTER — Telehealth: Payer: Self-pay | Admitting: Primary Care

## 2019-09-11 NOTE — Telephone Encounter (Signed)
Spoke with patient's granddaughter Towanda Octave regarding Palliative services and she was in agreement with this.  I have scheduled a Telephone Consult for 09/13/19 @ 10 AM.

## 2019-09-13 ENCOUNTER — Other Ambulatory Visit: Payer: Medicare Other | Admitting: Primary Care

## 2019-09-13 ENCOUNTER — Other Ambulatory Visit: Payer: Self-pay

## 2019-09-13 DIAGNOSIS — Z515 Encounter for palliative care: Secondary | ICD-10-CM

## 2019-09-13 LAB — URINE CULTURE: Culture: 70000 — AB

## 2019-09-13 NOTE — Progress Notes (Signed)
[facility] Palliative Care Consult Note Telephone: [phone]  Fax: [phone]  TELEHEALTH VISIT STATEMENT Due to the COVID-19 crisis, this visit was done via telemedicine from my office. It was initiated and consented to by this patient and/or family.  PATIENT NAME: Emily Martin [id number] [phone] (home)  DOB: 23-Sep-1925 MRN: [id number]  PRIMARY CARE PROVIDER:   Derinda Late, MD, [address] [facility] [place] [address] [phone]  REFERRING PROVIDER:  Derinda Late, MD [phone] [address] [facility] [place],  [place] [address] [phone]  RESPONSIBLE PARTY:   Extended Emergency Contact Information Primary Emergency Contact: Emily Martin Address: [place] [address] Phone: [phone] Relation: Granddaughter Secondary Emergency Contact: Emily Martin Mobile Phone: [phone] Relation: Daughter  I met with granddaughter by phone due to lack of technology for telemedicien.  ASSESSMENT AND RECOMMENDATIONS:   1. Advance Care Planning/Goals of Care: Goals include to maximize quality of life and symptom management. Lives with granddaughter, was able to walk and do some self care 2 months ago. She is now not eating, bed bound and has abdominal fluid from tumor burden.  She is rapidly declining. We discuss GOC and family chooses comfort care, no more hospitalizations, DNR. Hospice referral is appropriate at this time, and they agree. I will upload DNR in [place].  2. Symptom Management:   Debility:  Decline has been gradual from several weeks in swallowing. She has been holding pills in her mouth. Intake is declining as well. Complaining of pain on swallowing. She had pelvic masses for some time, which now have been determined to be ovarian cancer. Her pain is escalating, intake is very poor and PPS has  dropped from 40 to 20% in a few weeks. Family would like appropriate end of life care in the home, and elect hospice. Tc to PCP's office, who agrees to give orders. Referral made to [facility].  Pain: Taking hydrocodone q 4 hrs. Can get relief from 10/10 to 2/10 but wearing off in 3 hours.  3. Family /Caregiver/Community Supports: Lives with granddaughter and her family.   4. Cognitive / Functional decline: Rapid cognitive and physical decline from end stage cancer disease process.  5. Follow up Palliative Care Visit: Immediate referral to hospice.  I spent 40 minutes providing this consultation,  from 1030 to 1110. More than 50% of the time in this consultation was spent coordinating communication.   HISTORY OF PRESENT ILLNESS:  Emily Martin is a 86 y.o. year old female with multiple medical problems including CHF, dementia, presumed ovarian cancer. Palliative Care was asked to follow this patient by consultation request of Derinda Late, MD to help address advance care planning and goals of care. This is the initial visit.  CODE STATUS: DNR  PPS: 30% weak HOSPICE ELIGIBILITY/DIAGNOSIS: yes/ovarian cancer  PAST MEDICAL HISTORY: Ovarian tumors, not bx but clinically dx as carcinoma. Past Medical History:  Diagnosis Date  . CHF (congestive heart failure) ([place])   . Dementia ([place])   . Hypertension     SOCIAL HX:  Social History   Tobacco Use  . Smoking status: Never Smoker  . Smokeless tobacco: Never Used  Substance Use Topics  . Alcohol use: Not Currently    ALLERGIES:  Allergies  Allergen Reactions  . Lisinopril      PERTINENT MEDICATIONS:  Outpatient Encounter Medications as of 09/13/2019  Medication Sig  .  cephALEXin (KEFLEX) 500 MG capsule Take 1 capsule (500 mg total) by mouth 4 (four) times daily for 10 days.   No facility-administered encounter medications on file as of 09/13/2019.    PHYSICAL EXAM / ROS:   Current and past weights: not available  General: NAD, frail,   Cardiovascular: no chest pain reported, no edema reported Pulmonary: no cough, no increased SOB, room air Abdomen: appetite poor, endorces ascites, endorses constipation, incontinent of bowel today GU: denies dysuria, incontinent of urine MSK:  no joint deformities, non ambulatory Skin: no rashes or wounds reported Neurological: Weakness, unbearable pain (10/10)  Jason Coop, NP [facility]

## 2019-09-14 NOTE — Consult Note (Signed)
Urine culture was positive for enterococcus faecalis and E.coli resulted on 1/28 AM. Sq Epith on UA was 11-20 - not a clean catch. Enterococcus might be a contaminant. Pt has transitioned to comfort care per palliative note on 1/28. Pt was discharged Keflex, which is sensitive to the E.coli. Discussed case with Dr. Jimmye Norman, No changes needed to the regimen at this time.   Eleonore Chiquito, PharmD, BCPS.

## 2019-09-17 DEATH — deceased

## 2019-10-15 DEATH — deceased

## 2020-06-15 IMAGING — CT CT HEAD W/O CM
3 series · 14 of 44 positions shown, 16 images · non-contrast
Comparison: CT brain 04/08/2019

CLINICAL DATA: Fall

EXAM:
CT HEAD WITHOUT CONTRAST
CT CERVICAL SPINE WITHOUT CONTRAST
TECHNIQUE: Multidetector CT imaging of the head and cervical spine was
performed following the standard protocol without intravenous
contrast. Multiplanar CT image reconstructions of the cervical spine
were also generated.

[Series 3: head wo · axial · 0.47mm/px · z∈[-144,-34]mm · 8 of 27 slices shown, 10 images]
[im 3/27  brain]
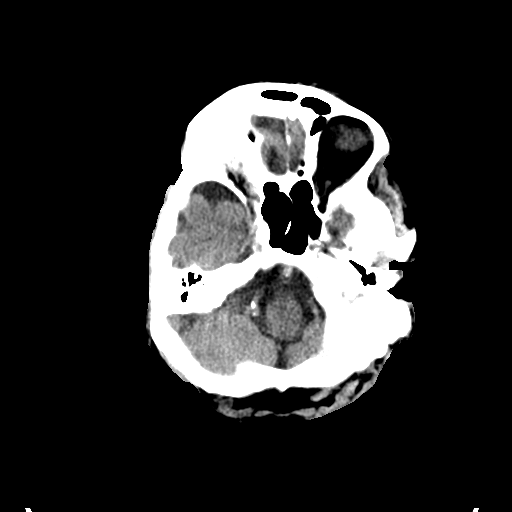
[im 3/27  bone]
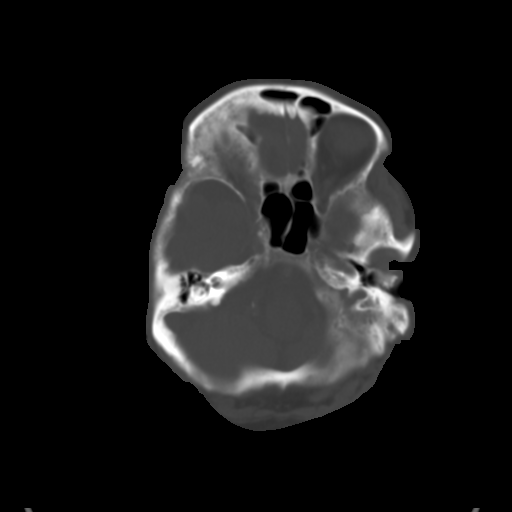
[im 6/27  brain]
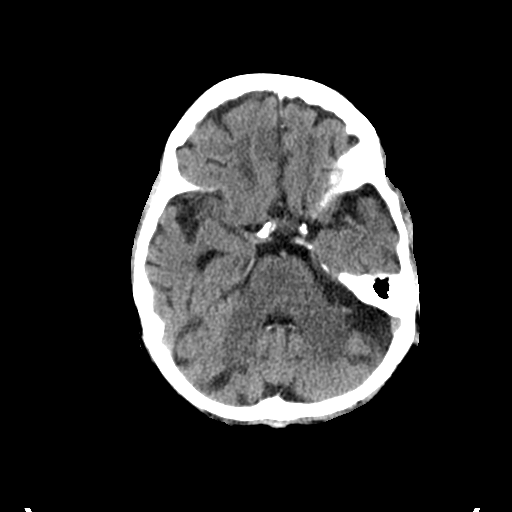
[im 9/27  brain]
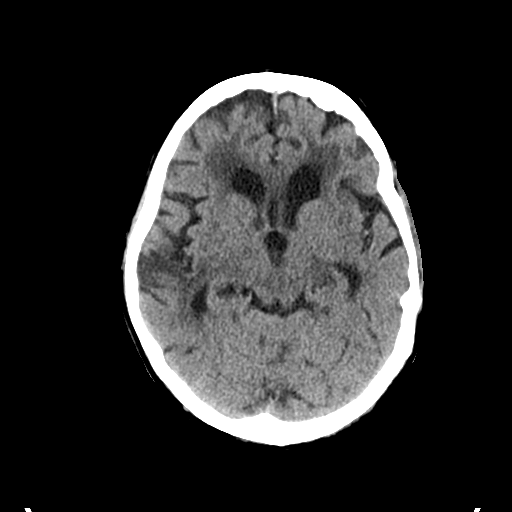
[im 12/27  brain]
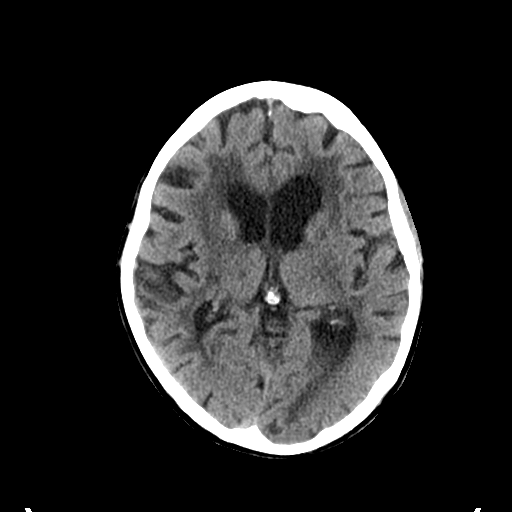
[im 16/27  brain]
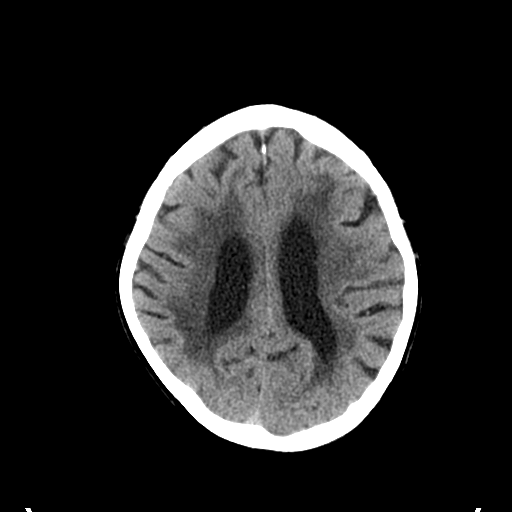
[im 16/27  bone]
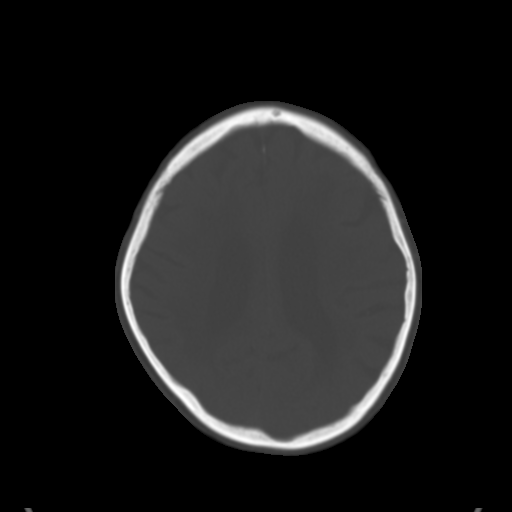
[im 19/27  brain]
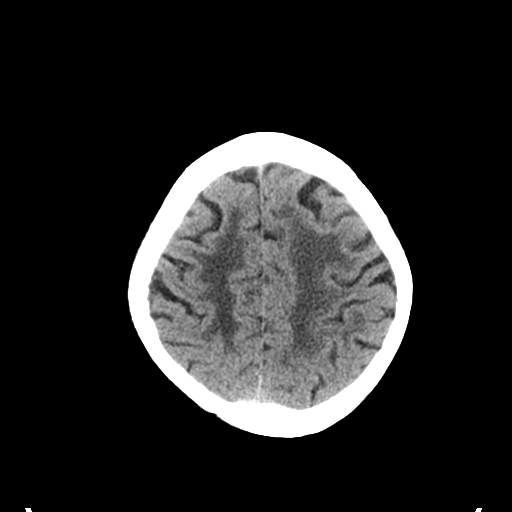
[im 22/27  brain]
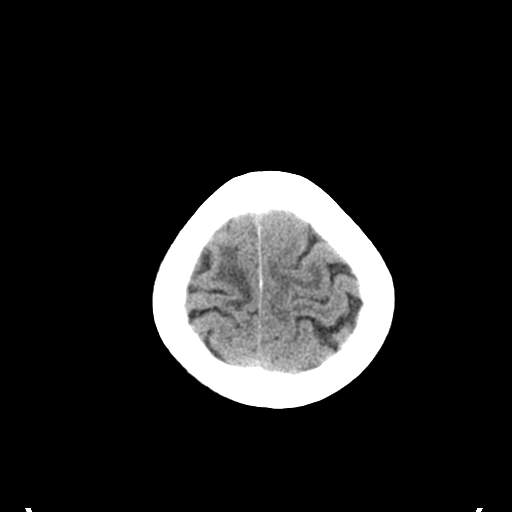
[im 25/27  brain]
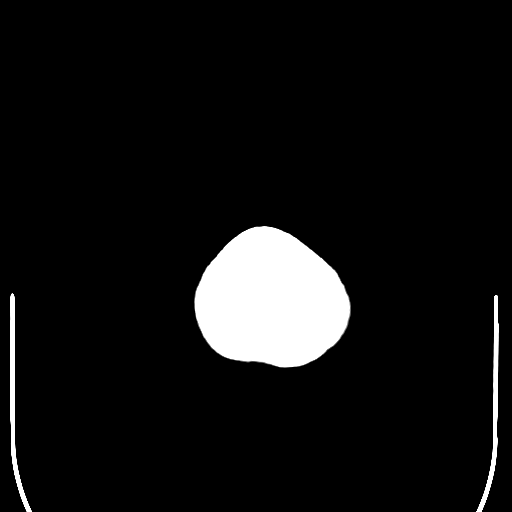

[Series 4: coronal soft tissue · coronal · 0.26mm/px · 3 of 65 slices shown]
[im 22/65  brain]
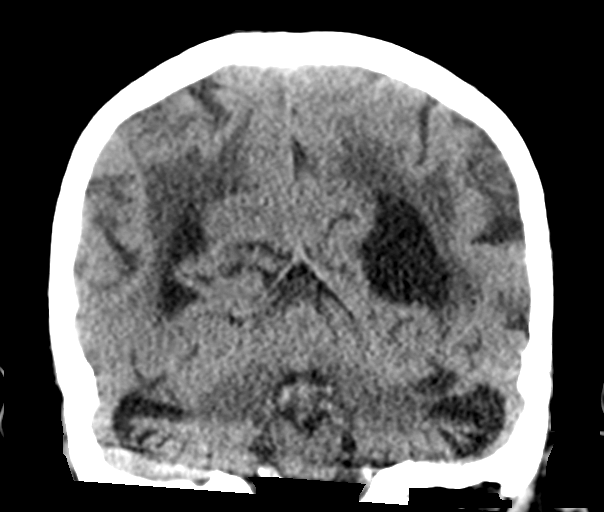
[im 29/65  brain]
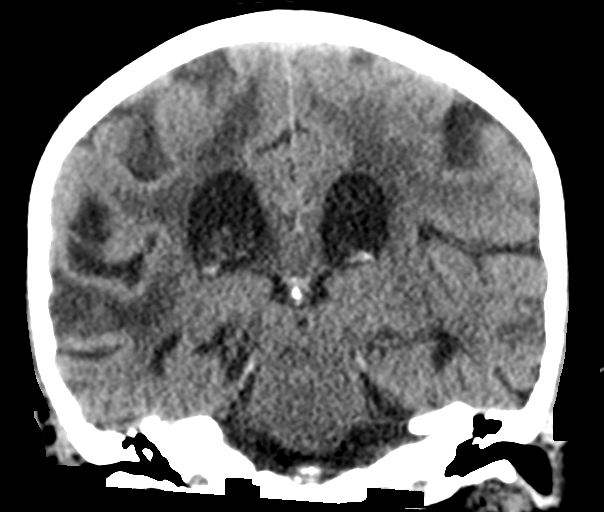
[im 36/65  brain]
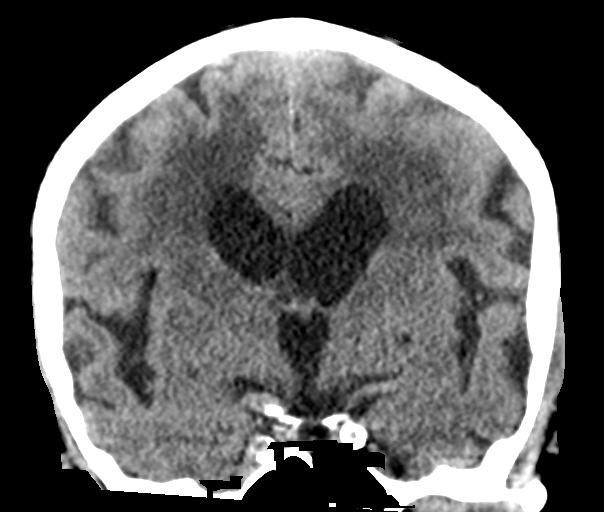

[Series 5: sagittal soft tissue · sagittal · 0.26mm/px · 3 of 53 slices shown]
[im 18/53  brain]
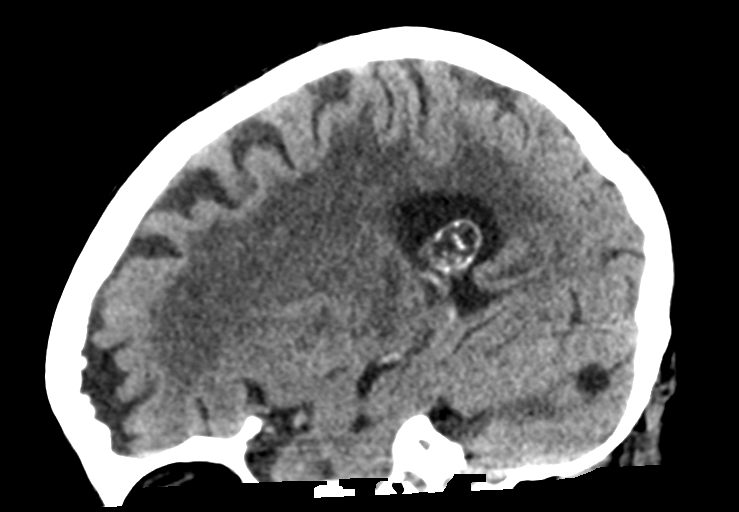
[im 27/53  brain]
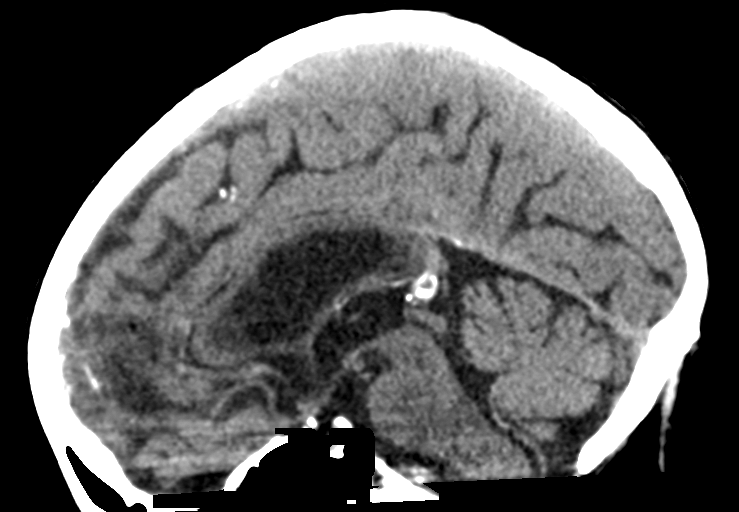
[im 35/53  brain]
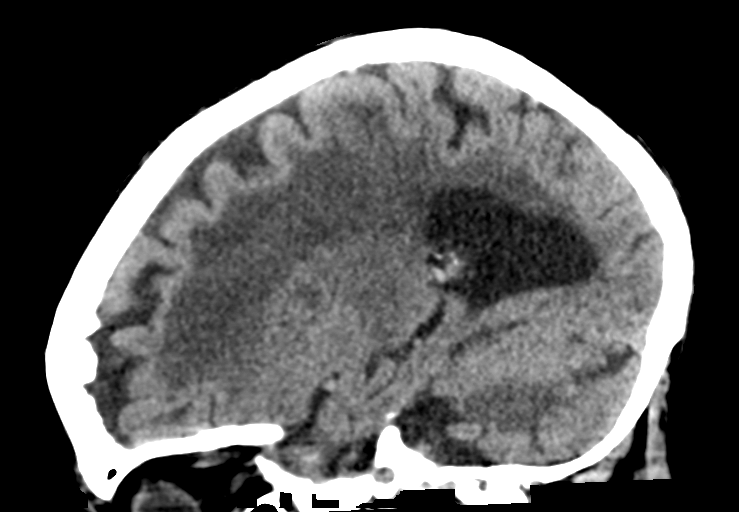

[14 of 44 positions shown; findings below may reference images not displayed]

FINDINGS: CT HEAD FINDINGS

Brain: No acute territorial infarction, hemorrhage or intracranial
mass. Atrophy. Extensive hypodensity within the white matter
presumably due to chronic small vessel ischemic change. Chronic
lacunar infarcts in the basal ganglia. Chronic small bilateral
infarcts within the cerebellum. Encephalomalacia within the right
temporal lobe consistent with chronic infarct. Mildly enlarged
ventricles without change

Vascular: No hyperdense vessels. Carotid vascular and vertebral
artery calcification

Skull: Normal. Negative for fracture or focal lesion.

Sinuses/Orbits: Probable old fracture deformity medial wall left
orbit. Small fluid in the right sphenoid sinus. Mucosal thickening
in the ethmoid sinuses

Other: None

CT CERVICAL SPINE FINDINGS

Alignment: No subluxation.  Facet alignment within normal limits.

Skull base and vertebrae: No acute fracture. No primary bone lesion
or focal pathologic process.

Soft tissues and spinal canal: No prevertebral fluid or swelling. No
visible canal hematoma.

Disc levels: Mild diffuse degenerative changes of the cervical
spine. Multiple level facet degenerative change.

Upper chest: Lung apices are clear. 1 cm calcified nodule right lobe
of thyroid. 1.3 cm hypodense nodule left lobe of thyroid. No further
workup recommended based on size of lesion and age of patient.

Other: None
IMPRESSION: 1. No CT evidence for acute intracranial abnormality. Atrophy and
extensive chronic small vessel ischemic change of the white matter.
Chronic right temporal lobe infarct.
2. Degenerative changes of the cervical spine. No acute osseous
abnormality.
